# Patient Record
Sex: Male | Born: 1970 | Race: White | Hispanic: No | Marital: Single | Smoking: Never smoker
Health system: Southern US, Community
[De-identification: ages and names within clinical notes are randomized; demographics above are authoritative.]

## PROBLEM LIST (undated history)

## (undated) DIAGNOSIS — Z87442 Personal history of urinary calculi: Secondary | ICD-10-CM

## (undated) DIAGNOSIS — N200 Calculus of kidney: Secondary | ICD-10-CM

## (undated) HISTORY — PX: HERNIA REPAIR: SHX51

## (undated) HISTORY — PX: ARTHROSCOPIC REPAIR ACL: SUR80

---

## 2015-05-27 ENCOUNTER — Encounter (HOSPITAL_COMMUNITY): Payer: Self-pay | Admitting: Emergency Medicine

## 2015-05-27 ENCOUNTER — Emergency Department (HOSPITAL_COMMUNITY)
Admission: EM | Admit: 2015-05-27 | Discharge: 2015-05-27 | Disposition: A | Discharge status: Home / Self Care | Payer: BC Managed Care – PPO | Attending: Emergency Medicine | Admitting: Emergency Medicine

## 2015-05-27 ENCOUNTER — Emergency Department (HOSPITAL_COMMUNITY): Payer: BC Managed Care – PPO

## 2015-05-27 DIAGNOSIS — N23 Unspecified renal colic: Secondary | ICD-10-CM

## 2015-05-27 DIAGNOSIS — Z87442 Personal history of urinary calculi: Secondary | ICD-10-CM | POA: Insufficient documentation

## 2015-05-27 DIAGNOSIS — R109 Unspecified abdominal pain: Secondary | ICD-10-CM | POA: Diagnosis present

## 2015-05-27 HISTORY — DX: Calculus of kidney: N20.0

## 2015-05-27 LAB — URINALYSIS, ROUTINE W REFLEX MICROSCOPIC
BILIRUBIN URINE: NEGATIVE
GLUCOSE, UA: NEGATIVE mg/dL
Ketones, ur: NEGATIVE mg/dL
Leukocytes, UA: NEGATIVE
Nitrite: NEGATIVE
PH: 8 (ref 5.0–8.0)
Protein, ur: NEGATIVE mg/dL
SPECIFIC GRAVITY, URINE: 1.02 (ref 1.005–1.030)
Urobilinogen, UA: 0.2 mg/dL (ref 0.0–1.0)

## 2015-05-27 LAB — URINE MICROSCOPIC-ADD ON

## 2015-05-27 MED ORDER — ONDANSETRON HCL 4 MG/2ML IJ SOLN
4.0000 mg | Freq: Once | INTRAMUSCULAR | Status: AC
  Administered 2015-05-27: 4 mg via INTRAVENOUS
  Filled 2015-05-27: qty 2

## 2015-05-27 MED ORDER — HYDROCODONE-ACETAMINOPHEN 5-325 MG PO TABS: 2.0000 | ORAL_TABLET | ORAL | Status: AC | PRN

## 2015-05-27 MED ORDER — KETOROLAC TROMETHAMINE 30 MG/ML IJ SOLN
30.0000 mg | Freq: Once | INTRAMUSCULAR | Status: AC
  Administered 2015-05-27: 30 mg via INTRAVENOUS
  Filled 2015-05-27: qty 1

## 2015-05-27 MED ORDER — TAMSULOSIN HCL 0.4 MG PO CAPS: 0.4000 mg | ORAL_CAPSULE | Freq: Every day | ORAL | Status: AC

## 2015-05-27 MED ORDER — HYDROMORPHONE HCL 1 MG/ML IJ SOLN
1.0000 mg | Freq: Once | INTRAMUSCULAR | Status: AC
Start: 2015-05-27 — End: 2015-05-27
  Administered 2015-05-27: 1 mg via INTRAVENOUS
  Filled 2015-05-27: qty 1

## 2015-05-27 MED ORDER — ONDANSETRON 8 MG PO TBDP: 8.0000 mg | ORAL_TABLET | Freq: Three times a day (TID) | ORAL | Status: AC | PRN

## 2015-05-27 MED ORDER — OXYCODONE-ACETAMINOPHEN 5-325 MG PO TABS: 1.0000 | ORAL_TABLET | ORAL | Status: DC | PRN

## 2015-05-27 NOTE — ED Notes (Signed)
Pt currently does not want anti nausea medicine.

## 2015-05-27 NOTE — ED Notes (Signed)
Pt is in to much pain to give a urine sample at this time.

## 2015-05-27 NOTE — ED Provider Notes (Signed)
CSN: [id number]     Arrival date & time 05/27/15  4098 History   First MD Initiated Contact with Patient 05/27/15 [phone]     Chief Complaint  Patient presents with  . Flank Pain     (Consider location/radiation/quality/duration/timing/severity/associated sxs/prior Treatment) HPI Comments: H/o kidney stones and this is similar--usally caused by dehydration--pain at right flank and radiates to rlq  Patient is a 44 y.o. male presenting with flank pain. The history is provided by the patient.  Flank Pain This is a recurrent problem. The current episode started 1 to 2 hours ago. The problem occurs constantly. The problem has been rapidly worsening. Nothing aggravates the symptoms. Nothing relieves the symptoms. He has tried nothing for the symptoms.    Past Medical History  Diagnosis Date  . Kidney stone    Past Surgical History  Procedure Laterality Date  . Arthroscopic repair acl    . Hernia repair     Family History  Problem Relation Age of Onset  . CAD Father   . CAD Brother    Social History  Substance Use Topics  . Smoking status: Never Smoker   . Smokeless tobacco: None  . Alcohol Use: No    Review of Systems  Genitourinary: Positive for flank pain.  All other systems reviewed and are negative.     Allergies  Review of patient's allergies indicates no known allergies.  Home Medications   Prior to Admission medications   Not on File   BP 104/59 mmHg  Pulse 87  Resp 24  SpO2 100% Physical Exam  Constitutional: He is oriented to person, place, and time. He appears well-developed and well-nourished.  Non-toxic appearance. No distress.  HENT:  Head: Normocephalic and atraumatic.  Eyes: Conjunctivae, EOM and lids are normal. Pupils are equal, round, and reactive to light.  Neck: Normal range of motion. Neck supple. No tracheal deviation present. No thyroid mass present.  Cardiovascular: Normal rate, regular rhythm and normal heart sounds.  Exam reveals no  gallop.   No murmur heard. Pulmonary/Chest: Effort normal and breath sounds normal. No stridor. No respiratory distress. He has no decreased breath sounds. He has no wheezes. He has no rhonchi. He has no rales.  Abdominal: Soft. Normal appearance and bowel sounds are normal. He exhibits no distension. There is tenderness. There is CVA tenderness. There is no rebound.  Musculoskeletal: Normal range of motion. He exhibits no edema or tenderness.  Neurological: He is alert and oriented to person, place, and time. He has normal strength. No cranial nerve deficit or sensory deficit. GCS eye subscore is 4. GCS verbal subscore is 5. GCS motor subscore is 6.  Skin: Skin is warm and dry. No abrasion and no rash noted.  Psychiatric: He has a normal mood and affect. His speech is normal and behavior is normal.  Nursing note and vitals reviewed.   ED Course  Procedures (including critical care time) Labs Review Labs Reviewed  URINALYSIS, ROUTINE W REFLEX MICROSCOPIC (NOT AT [facility])    Imaging Review No results found. I have personally reviewed and evaluated these images and lab results as part of my medical decision-making.   EKG Interpretation None      MDM   Final diagnoses:  None   patient given pain meds here feels better. Results of his abdominal CT discussed with him and will be given pain meds and he can follow-up his urologist    Lorre Nick, MD 05/27/15 1004

## 2015-05-27 NOTE — ED Notes (Signed)
Pt states he woke this morning with right flank pain  Pt states he has had nausea and vomiting with the pain  Pt states the pain is now radiating around to the front

## 2015-05-27 NOTE — ED Notes (Signed)
Nurse tried to obtain a temp for pt but pt was unable to hold it in his mouth.

## 2015-05-27 NOTE — ED Notes (Signed)
Pt reminded of need for urine spcimen

## 2015-05-27 NOTE — Discharge Instructions (Signed)
Follow-up with your urologist in a week Kidney Stones Kidney stones (urolithiasis) are deposits that form inside your kidneys. The intense pain is caused by the stone moving through the urinary tract. When the stone moves, the ureter goes into spasm around the stone. The stone is usually passed in the urine.  CAUSES   A disorder that makes certain neck glands produce too much parathyroid hormone (primary hyperparathyroidism).  A buildup of uric acid crystals, similar to gout in your joints.  Narrowing (stricture) of the ureter.  A kidney obstruction present at birth (congenital obstruction).  Previous surgery on the kidney or ureters.  Numerous kidney infections. SYMPTOMS   Feeling sick to your stomach (nauseous).  Throwing up (vomiting).  Blood in the urine (hematuria).  Pain that usually spreads (radiates) to the groin.  Frequency or urgency of urination. DIAGNOSIS   Taking a history and physical exam.  Blood or urine tests.  CT scan.  Occasionally, an examination of the inside of the urinary bladder (cystoscopy) is performed. TREATMENT   Observation.  Increasing your fluid intake.  Extracorporeal shock wave lithotripsy--This is a noninvasive procedure that uses shock waves to break up kidney stones.  Surgery may be needed if you have severe pain or persistent obstruction. There are various surgical procedures. Most of the procedures are performed with the use of small instruments. Only small incisions are needed to accommodate these instruments, so recovery time is minimized. The size, location, and chemical composition are all important variables that will determine the proper choice of action for you. Talk to your health care provider to better understand your situation so that you will minimize the risk of injury to yourself and your kidney.  HOME CARE INSTRUCTIONS   Drink enough water and fluids to keep your urine clear or pale yellow. This will help you to pass  the stone or stone fragments.  Strain all urine through the provided strainer. Keep all particulate matter and stones for your health care provider to see. The stone causing the pain may be as small as a grain of salt. It is very important to use the strainer each and every time you pass your urine. The collection of your stone will allow your health care provider to analyze it and verify that a stone has actually passed. The stone analysis will often identify what you can do to reduce the incidence of recurrences.  Only take over-the-counter or prescription medicines for pain, discomfort, or fever as directed by your health care provider.  Make a follow-up appointment with your health care provider as directed.  Get follow-up X-rays if required. The absence of pain does not always mean that the stone has passed. It may have only stopped moving. If the urine remains completely obstructed, it can cause loss of kidney function or even complete destruction of the kidney. It is your responsibility to make sure X-rays and follow-ups are completed. Ultrasounds of the kidney can show blockages and the status of the kidney. Ultrasounds are not associated with any radiation and can be performed easily in a matter of minutes. SEEK MEDICAL CARE IF:  You experience pain that is progressive and unresponsive to any pain medicine you have been prescribed. SEEK IMMEDIATE MEDICAL CARE IF:   Pain cannot be controlled with the prescribed medicine.  You have a fever or shaking chills.  The severity or intensity of pain increases over 18 hours and is not relieved by pain medicine.  You develop a new onset of abdominal pain.  You feel faint or pass out.  You are unable to urinate. MAKE SURE YOU:   Understand these instructions.  Will watch your condition.  Will get help right away if you are not doing well or get worse. Document Released: 09/12/2005 Document Revised: 05/15/2013 Document Reviewed:  02/13/2013 [facility] Patient Information 2015 [place], [place]. This information is not intended to replace advice given to you by your health care provider. Make sure you discuss any questions you have with your health care provider.

## 2015-05-27 NOTE — ED Notes (Signed)
Pt given cup of water to attempt to provide urine specimen

## 2016-11-19 ENCOUNTER — Encounter (HOSPITAL_COMMUNITY): Payer: Self-pay | Admitting: Nurse Practitioner

## 2016-11-19 ENCOUNTER — Emergency Department (HOSPITAL_COMMUNITY): Payer: BC Managed Care – PPO

## 2016-11-19 ENCOUNTER — Emergency Department (HOSPITAL_COMMUNITY)
Admission: EM | Admit: 2016-11-19 | Discharge: 2016-11-19 | Disposition: A | Discharge status: Home / Self Care | Payer: BC Managed Care – PPO | Attending: Emergency Medicine | Admitting: Emergency Medicine

## 2016-11-19 DIAGNOSIS — W268XXA Contact with other sharp object(s), not elsewhere classified, initial encounter: Secondary | ICD-10-CM | POA: Diagnosis not present

## 2016-11-19 DIAGNOSIS — Z23 Encounter for immunization: Secondary | ICD-10-CM | POA: Insufficient documentation

## 2016-11-19 DIAGNOSIS — Y929 Unspecified place or not applicable: Secondary | ICD-10-CM | POA: Diagnosis not present

## 2016-11-19 DIAGNOSIS — Z79899 Other long term (current) drug therapy: Secondary | ICD-10-CM | POA: Diagnosis not present

## 2016-11-19 DIAGNOSIS — S61211A Laceration without foreign body of left index finger without damage to nail, initial encounter: Secondary | ICD-10-CM | POA: Insufficient documentation

## 2016-11-19 DIAGNOSIS — Y999 Unspecified external cause status: Secondary | ICD-10-CM | POA: Insufficient documentation

## 2016-11-19 DIAGNOSIS — Z7982 Long term (current) use of aspirin: Secondary | ICD-10-CM | POA: Insufficient documentation

## 2016-11-19 DIAGNOSIS — Y939 Activity, unspecified: Secondary | ICD-10-CM | POA: Insufficient documentation

## 2016-11-19 MED ORDER — TETANUS-DIPHTH-ACELL PERTUSSIS 5-2.5-18.5 LF-MCG/0.5 IM SUSP
0.5000 mL | Freq: Once | INTRAMUSCULAR | Status: AC
  Administered 2016-11-19: 0.5 mL via INTRAMUSCULAR
  Filled 2016-11-19: qty 0.5

## 2016-11-19 MED ORDER — LIDOCAINE HCL 2 % IJ SOLN
20.0000 mL | Freq: Once | INTRAMUSCULAR | Status: AC
Start: 2016-11-19 — End: 2016-11-19
  Administered 2016-11-19: 400 mg
  Filled 2016-11-19: qty 20

## 2016-11-19 MED ORDER — CEPHALEXIN 500 MG PO CAPS: 500.0000 mg | ORAL_CAPSULE | Freq: Two times a day (BID) | ORAL | 0 refills | Status: DC

## 2016-11-19 NOTE — ED Triage Notes (Addendum)
Pt presents with c/o laceration. He has deep laceration L 2nd digit, cut with pruner just prior to arrival. He washed with water, held pressure, and came straight to ED. Bleeding is controlled now. He does not have any pain now. He is able to move the digit and reports normal sensation.

## 2016-11-19 NOTE — ED Provider Notes (Signed)
[facility]-EMERGENCY DEPT Provider Note   CSN: [id number] Arrival date & time: 11/19/16  1125     History   Chief Complaint Chief Complaint  Patient presents with  . Laceration    HPI Bob Anderson is a 46 y.o. male.  HPI   46 year old male presents today with laceration to his left second digit.  Patient reports he was cutting trees when he lacerated his finger.  He notes full active range of motion, distal sensation intact.  He rinsed the wound with water held pressure with good hemostasis.  Patient is uncertain when his last tetanus was and would like another one.    Past Medical History:  Diagnosis Date  . Kidney stone     There are no active problems to display for this patient.   Past Surgical History:  Procedure Laterality Date  . ARTHROSCOPIC REPAIR ACL    . HERNIA REPAIR         Home Medications    Prior to Admission medications   Medication Sig Start Date End Date Taking? Authorizing Provider  aspirin 325 MG tablet Take 325 mg by mouth daily.   Yes Historical Provider, MD  glucosamine-chondroitin 500-400 MG tablet Take 2 tablets by mouth daily.   Yes Historical Provider, MD  Omega-3 Fatty Acids (FISH OIL) 1000 MG CAPS Take 1,000 mg by mouth daily.   Yes Historical Provider, MD  predniSONE (DELTASONE) 10 MG tablet Take 10 mg by mouth daily as needed (inflamation).   Yes Historical Provider, MD  cephALEXin (KEFLEX) 500 MG capsule Take 1 capsule (500 mg total) by mouth 2 (two) times daily. 11/19/16   Eyvonne MechanicJeffrey Marquesha Robideau, PA-C  HYDROcodone-acetaminophen (NORCO/VICODIN) 5-325 MG per tablet Take 2 tablets by mouth every 4 (four) hours as needed. Patient not taking: Reported on 11/19/2016 05/27/15   Lorre NickAnthony Allen, MD  ondansetron (ZOFRAN ODT) 8 MG disintegrating tablet Take 1 tablet (8 mg total) by mouth every 8 (eight) hours as needed for nausea or vomiting. Patient not taking: Reported on 11/19/2016 05/27/15   Lorre NickAnthony Allen, MD  tamsulosin [facility](FLOMAX) 0.4 MG CAPS capsule Take 1  capsule (0.4 mg total) by mouth daily. Patient not taking: Reported on 11/19/2016 05/27/15   Lorre NickAnthony Allen, MD    Family History Family History  Problem Relation Age of Onset  . CAD Father   . CAD Brother     Social History Social History  Substance Use Topics  . Smoking status: Never Smoker  . Smokeless tobacco: Never Used  . Alcohol use No     Allergies   Patient has no known allergies.   Review of Systems Review of Systems  All other systems reviewed and are negative.    Physical Exam Updated Vital Signs BP 126/86 (BP Location: Right Arm)   Pulse 85   Temp 97.6 F (36.4 C) (Oral)   Resp 16   SpO2 99%   Physical Exam  Musculoskeletal:  Laceration noted on the left second digit along the radial side-no tendon, major vessel involvement.  Full active range of motion of the digit at all joints.  Distal sensation intact.  Nursing note and vitals reviewed.    ED Treatments / Results  Labs (all labs ordered are listed, but only abnormal results are displayed) Labs Reviewed - No data to display  EKG  EKG Interpretation None       Radiology Dg Finger Index Left  Result Date: 11/19/2016 CLINICAL DATA:  46 year old male with left index finger laceration induced by a hedge cutter.  EXAM: LEFT INDEX FINGER 2+V COMPARISON:  None. FINDINGS: Soft tissue irregularity along the radial and palmar aspect of the index finger at the level of the middle phalanx consistent with the clinical history of laceration. There is no evidence of acute fracture or imbedded radiopaque foreign body. IMPRESSION: Soft tissue laceration without underlying osseous injury or imbedded foreign body. Electronically Signed   By: Malachy Moan M.D.   On: 11/19/2016 12:44    Procedures Procedures (including critical care time)  LACERATION REPAIR Performed by: Thermon Leyland Authorized by: Thermon Leyland Consent: Verbal consent obtained. Risks and benefits: risks, benefits and  alternatives were discussed Consent given by: patient Patient identity confirmed: provided demographic data Prepped and Draped in normal sterile fashion Wound explored  Laceration Location: left second digit  Laceration Length: 2 cmcm  No Foreign Bodies seen or palpated  Anesthesia: local infiltration  Local anesthetic: digital block  Anesthetic total: 4 ml  Irrigation method: syringe Amount of cleaning: standard  Skin closure: simple  Number of sutures: 6  Technique: SI  Patient tolerance: Patient tolerated the procedure well with no immediate complications.  Medications Ordered in ED Medications  lidocaine (XYLOCAINE) 2 % (with pres) injection 400 mg (400 mg Infiltration Given 11/19/16 1255)  Tdap (BOOSTRIX) injection 0.5 mL (0.5 mLs Intramuscular Given 11/19/16 1330)     Initial Impression / Assessment and Plan / ED Course  I have reviewed the triage vital signs and the nursing notes.  Pertinent labs & imaging results that were available during my care of the patient were reviewed by me and considered in my medical decision making (see chart for details).     Final Clinical Impressions(s) / ED Diagnoses   Final diagnoses:  Laceration of left index finger without damage to nail, foreign body presence unspecified, initial encounter    Labs: DG. finger left  Imaging:  Consults:  Therapeutics: Lidocaine  Discharge Meds: kelfex  Assessment/Plan:   46 year old male presents today with a laceration to his finger.  He has full active range of motion finger at all joints, sensation intact, bleeding controlled.  No signs of major nerve, vessel, tendon involvement.  Wound was copiously irrigated and closed without complication.  Patient was trimming trees and concern for examination.  He will be placed on 3 days of prophylactic antibiotic, close follow-up with primary care, return precautions given.  Patient verbalized understanding and agreement to this plan and  had no further questions or concerns.      New Prescriptions New Prescriptions   CEPHALEXIN (KEFLEX) 500 MG CAPSULE    Take 1 capsule (500 mg total) by mouth 2 (two) times daily.     Eyvonne Mechanic, PA-C 11/19/16 1354    Raeford Razor, MD 12/05/16 [phone]

## 2016-11-19 NOTE — Discharge Instructions (Signed)
Please read attached information. If you experience any new or worsening signs or symptoms please return to the emergency room for evaluation. Please follow-up with your primary care provider or specialist as discussed. Please use medication prescribed only as directed and discontinue taking if you have any concerning signs or symptoms.   °

## 2016-11-19 NOTE — ED Notes (Signed)
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

## 2017-05-26 ENCOUNTER — Other Ambulatory Visit: Payer: Self-pay | Admitting: Urology

## 2017-06-01 ENCOUNTER — Other Ambulatory Visit (HOSPITAL_COMMUNITY): Payer: BC Managed Care – PPO

## 2017-06-02 NOTE — Patient Instructions (Addendum)
Bob AllanDavid Anderson  06/02/2017   Your procedure is scheduled on: 06-08-17   [facility]  Entr[facility] Take [facility] [facility] to 3rd floor to  Short Stay Center at 10:45 AM.   Call this number if you have problems the morning of surgery [phone]    Remember: ONLY 1 PERSON MAY GO WITH YOU TO SHORT STAY TO GET  READY MORNING OF YOUR SURGERY.  Do not eat food or drink liquids :After Midnight. You may have a Clear Liquid Diet from Midnight until 7:15 AM. After 7:15AM, nothing until after surgery.     CLEAR LIQUID DIET   Foods Allowed                                                                     Foods Excluded  Coffee and tea, regular and decaf                             liquids that you cannot  Plain Jell-O in any flavor                                             see through such as: Fruit ices (not with fruit pulp)                                     milk, soups, orange juice  Iced Popsicles                                    All solid food Carbonated beverages, regular and diet                                    Cranberry, grape and apple juices Sports drinks like Gatorade Lightly seasoned clear broth or consume(fat free) Sugar, honey syrup  Sample Menu Breakfast                                Lunch                                     Supper Cranberry juice                    Beef broth                            Chicken broth Jell-O                                     Grape juice  Apple juice Coffee or tea                        Jell-O                                      Popsicle                                                Coffee or tea                        Coffee or tea  _____________________________________________________________________     Take these medicines the morning of surgery with A SIP OF WATER: None                                You may not have any metal on your body including hair pins and             piercings  Do not wear jewelry, make-up, lotions, powders or perfumes, deodorant             Men may shave face and neck.   Do not bring valuables to the hospital. [place] IS NOT             RESPONSIBLE   FOR VALUABLES.  Contacts, dentures or bridgework may not be worn into surgery.     Patients discharged the day of surgery will not be allowed to drive home.  Name and phone number of your driver: Tyler [phone]               Please read over the following fact sheets you were given: _____________________________________________________________________             [facility] - Preparing for Surgery Before surgery, you can play an important role.  Because skin is not sterile, your skin needs to be as free of germs as possible.  You can reduce the number of germs on your skin by washing with CHG (chlorahexidine gluconate) soap before surgery.  CHG is an antiseptic cleaner which kills germs and bonds with the skin to continue killing germs even after washing. Please DO NOT use if you have an allergy to CHG or antibacterial soaps.  If your skin becomes reddened/irritated stop using the CHG and inform your nurse when you arrive at Short Stay. Do not shave (including legs and underarms) for at least 48 hours prior to the first CHG shower.  You may shave your face/neck. Please follow these instructions carefully:  1.  Shower with CHG Soap the night before surgery and the  morning of Surgery.  2.  If you choose to wash your hair, wash your hair first as usual with your  normal  shampoo.  3.  After you shampoo, rinse your hair and body thoroughly to remove the  shampoo.                           4.  Use CHG as you would any other liquid soap.  You can apply chg directly  to the skin and wash  Gently with a scrungie or clean washcloth.  5.  Apply the CHG Soap to your body ONLY FROM THE NECK DOWN.   Do not use on face/ open                           Wound or  open sores. Avoid contact with eyes, ears mouth and genitals (private parts).                       Wash face,  Genitals (private parts) with your normal soap.             6.  Wash thoroughly, paying special attention to the area where your surgery  will be performed.  7.  Thoroughly rinse your body with warm water from the neck down.  8.  DO NOT shower/wash with your normal soap after using and rinsing off  the CHG Soap.                9.  Pat yourself dry with a clean towel.            10.  Wear clean pajamas.            11.  Place clean sheets on your bed the night of your first shower and do not  sleep with pets. Day of Surgery : Do not apply any lotions/deodorants the morning of surgery.  Please wear clean clothes to the hospital/surgery center.  FAILURE TO FOLLOW THESE INSTRUCTIONS MAY RESULT IN THE CANCELLATION OF YOUR SURGERY PATIENT SIGNATURE_________________________________  NURSE SIGNATURE__________________________________  ________________________________________________________________________

## 2017-06-06 ENCOUNTER — Encounter (HOSPITAL_COMMUNITY): Payer: Self-pay

## 2017-06-06 ENCOUNTER — Encounter (HOSPITAL_COMMUNITY)
Admission: RE | Admit: 2017-06-06 | Discharge: 2017-06-06 | Disposition: A | Discharge status: Home / Self Care | Payer: BC Managed Care – PPO | Source: Ambulatory Visit | Attending: Urology | Admitting: Urology

## 2017-06-06 DIAGNOSIS — Z87442 Personal history of urinary calculi: Secondary | ICD-10-CM | POA: Diagnosis not present

## 2017-06-06 DIAGNOSIS — N201 Calculus of ureter: Secondary | ICD-10-CM | POA: Diagnosis not present

## 2017-06-06 DIAGNOSIS — N411 Chronic prostatitis: Secondary | ICD-10-CM | POA: Diagnosis not present

## 2017-06-06 HISTORY — DX: Personal history of urinary calculi: Z87.442

## 2017-06-06 LAB — CBC
HCT: 41.5 % (ref 39.0–52.0)
Hemoglobin: 13.9 g/dL (ref 13.0–17.0)
MCH: 30 pg (ref 26.0–34.0)
MCHC: 33.5 g/dL (ref 30.0–36.0)
MCV: 89.4 fL (ref 78.0–100.0)
PLATELETS: 293 10*3/uL (ref 150–400)
RBC: 4.64 MIL/uL (ref 4.22–5.81)
RDW: 13.5 % (ref 11.5–15.5)
WBC: 5.6 10*3/uL (ref 4.0–10.5)

## 2017-06-06 LAB — BASIC METABOLIC PANEL
ANION GAP: 7 (ref 5–15)
BUN: 10 mg/dL (ref 6–20)
CALCIUM: 9.4 mg/dL (ref 8.9–10.3)
CHLORIDE: 105 mmol/L (ref 101–111)
CO2: 27 mmol/L (ref 22–32)
CREATININE: 0.96 mg/dL (ref 0.61–1.24)
GFR calc non Af Amer: >60 mL/min (ref >60)
GLUCOSE: 96 mg/dL (ref 65–99)
Potassium: 4.5 mmol/L (ref 3.5–5.1)
Sodium: 139 mmol/L (ref 135–145)

## 2017-06-07 NOTE — H&P (Signed)
  Print    Office Visit Report 05/25/2017    Bob Anderson         MRN: [id number]  PRIMARY CARE:  Bethann Punches, MD  DOB: 12-09-1970, 46 year old Male  REFERRING:  Bethann Punches, MD  SSN: [id number]  PROVIDER:  Ihor Gully, M.D.    TREATING:  Heloise Purpura, M.D.    LOCATION:  [facility] [phone]    CC/HPI: Right ureteral calculus   Bob Anderson returns today after having been diagnosed with a 3 mm distal right ureteral calculus by Dr. Vernie Ammons last week. He did have one stone episode 78 years ago and was able to pass this spontaneously. He also had a second stone episode 2 years ago and passed that stone spontaneously. No stone analysis was performed. 10 days ago, he developed the acute onset of severe right-sided flank pain with associated nausea and vomiting. His pain was relatively manageable until last Thursday when it again became severe and he presented to Dr. Vernie Ammons. He was prescribed pain medication although has had no pain since last Thursday evening. He has denied any fevers. Currently, he is pain-free. He has been very compliant with straining his urine but has not passed a stone.     ALLERGIES: Sulfa Antibiotics  Sulfa Drugs    MEDICATIONS: Hydrocodone-Acetaminophen 5 mg-300 mg tablet 1-2 tablet PO Q 4 H  Cipro 750 mg tablet Oral  Fish Oil  Flomax 0.4 mg capsule, ext release 24 hr 0 Oral Bedtime  Glucosamine & Chondroitin  Ibuprofen     GU PSH: None     PSH Notes: Hernia Repair, ACL replacement (2014)   NON-GU PSH: Hernia Repair - 2008        GU PMH: Flank Pain (Acute), Right, He has a 2.5 mm distal right ureteral stone. I told him that he had a fairly high probability of spontaneous passage. He already is on tamsulosin so I'm going to have him increase that to 0.8 mg and I have given him a urine strainer. I also have supplied him with pain medication. He told me that he has an appointment with Dr. Laverle Patter next week so I'll hold off on ordering a KUB  in case he passes his stone in the interim. - 05/18/2017 Chronic prostatitis, Prostatitis, chronic - 2014 Pelvic/perineal pain, Male pelvic pain - 2014 Ureteral calculus      PMH Notes:   1) Prostatitis/CPPS: I initially evaluated [facility] in 2008 for pelvic pain. He had a history of prostatitis and we discussed conservative therapy and physical therapy. He ultimately did not feel his symptoms were bothersome enough to warrant physical therapy.   2) Urolithiasis: He has a history of recurrent urolithiasis.    NON-GU PMH: None    FAMILY HISTORY: Arthritis - Mother nephrolithiasis - Father Prostate Cancer - Father    SOCIAL HISTORY: Marital Status: Single Preferred Language: English; Ethnicity: Not Hispanic Or Latino; Race: White Current Smoking Status: Patient has never smoked.  <DIV'  Tobacco Use Assessment Completed:  Used Tobacco in last 30 days?   Has never drank.  Patient's occupation Pharmacist, community, farmer.      Notes: Alcohol Use, Marital History - Single, Tobacco Use, Occupation:, Caffeine Use    REVIEW OF SYSTEMS:     GU Review Male:  Patient denies frequent urination, hard to postpone urination, burning/ pain with urination, get up at night to urinate, leakage of urine, stream starts and stops, trouble starting your streams,  and have to strain to urinate .    Gastrointestinal (Upper):  Patient denies nausea and vomiting.    Gastrointestinal (Lower):  Patient denies diarrhea and constipation.    Constitutional:  Patient denies fever, night sweats, weight loss, and fatigue.    Skin:  Patient denies skin rash/ lesion and itching.    Eyes:  Patient denies blurred vision and double vision.    Ears/ Nose/ Throat:  Patient denies sore throat and sinus problems.    Hematologic/Lymphatic:  Patient denies swollen glands and easy bruising.    Cardiovascular:  Patient denies leg swelling and chest pains.    Respiratory:  Patient denies shortness of breath and cough.    Endocrine:   Patient denies excessive thirst.    Musculoskeletal:  Patient denies back pain and joint pain.    Neurological:  Patient denies headaches and dizziness.    Psychologic:  Patient denies depression and anxiety.    VITAL SIGNS:       05/25/2017 08:18 AM     Weight 150 lb / 68.04 kg     BP 122/81 mmHg     Pulse 67 /min     MULTI-SYSTEM PHYSICAL EXAMINATION:      Constitutional: Well-nourished. No physical deformities. Normally developed. Good grooming.     Respiratory: No labored breathing, no use of accessory muscles.      Cardiovascular: Normal temperature, normal extremity pulses, no swelling, no varicosities.     Gastrointestinal: No mass, no tenderness, no rigidity, non obese abdomen. No CVA tenderness.            PAST DATA REVIEWED:   Source Of History:  Patient  Urine Test Review:  Urinalysis  X-Ray Review: KUB: Reviewed Films. I independently reviewed his KUB. He does have pelvic phleboliths and there is a calcification that correlates to his stone position measuring approximately 3 mm just above his phlebolith on the right side. C.T. Abdomen/Pelvis: Reviewed Films.     PROCEDURES:    KUB - [id number]  A single view of the abdomen is obtained.          Urinalysis w/Scope  Dipstick Dipstick Cont'd Micro  Color: Yellow Bilirubin: Neg WBC/hpf: NS (Not Seen)  Appearance: Clear Ketones: Neg RBC/hpf: 10 - 20/hpf  Specific Gravity: 1.025 Blood: 3+ Bacteria: Rare (0-9/hpf)  pH: <=5.0 Protein: Neg Cystals: NS (Not Seen)  Glucose: Neg Urobilinogen: 0.2 Casts: NS (Not Seen)   Nitrites: Neg Trichomonas: Not Present   Leukocyte Esterase: Neg Mucous: Present    Epithelial Cells: 0 - 5/hpf    Yeast: NS (Not Seen)    Sperm: Not Present    ASSESSMENT:     ICD-10 Details  1 GU:  Ureteral calculus - N20.1    PLAN:   Orders  X-Rays: KUB  Schedule  Return Visit/Planned Activity: Other See Visit Notes  Note: Will call to schedule surgery  Document  Letter(s):  Created for  Patient: Clinical Summary   Notes:  1. Right ureteral calculus: Considering his urinalysis and his KUB, I do not think he has passed his stone. He will continue to strain his urine and continue with medical expulsion therapy. He does have tamsulosin that he has been taking. He will tentatively plan for treatment with ureteroscopy in the next couple of weeks if he has not passed a stone. We reviewed this procedure in detail including the potential risks, complications, and expected recovery process as well as the potential need for a postoperative ureteral stent. He will call  us should he develop uncontrolled pain, fever, or persistent nausea/vomiting.   2. Recurrent urolithiasis: I will recommend a metabolic evaluation following his acute event.   Cc: Dr. Bethann Punches   * Signed by Heloise Purpura, M.D. on 05/25/17 at 6:07 PM (EDT)*

## 2017-06-08 ENCOUNTER — Encounter (HOSPITAL_COMMUNITY): Payer: Self-pay | Admitting: *Deleted

## 2017-06-08 ENCOUNTER — Encounter (HOSPITAL_COMMUNITY)
Admission: RE | Disposition: A | Discharge status: Home / Self Care | Payer: Self-pay | Source: Ambulatory Visit | Attending: Urology

## 2017-06-08 ENCOUNTER — Ambulatory Visit (HOSPITAL_COMMUNITY): Payer: BC Managed Care – PPO

## 2017-06-08 ENCOUNTER — Ambulatory Visit (HOSPITAL_COMMUNITY)
Admission: RE | Admit: 2017-06-08 | Discharge: 2017-06-08 | Disposition: A | Discharge status: Home / Self Care | Payer: BC Managed Care – PPO | Source: Ambulatory Visit | Attending: Urology | Admitting: Urology

## 2017-06-08 DIAGNOSIS — N411 Chronic prostatitis: Secondary | ICD-10-CM | POA: Insufficient documentation

## 2017-06-08 DIAGNOSIS — N201 Calculus of ureter: Secondary | ICD-10-CM | POA: Diagnosis not present

## 2017-06-08 DIAGNOSIS — Z87442 Personal history of urinary calculi: Secondary | ICD-10-CM | POA: Insufficient documentation

## 2017-06-08 HISTORY — PX: CYSTOSCOPY/URETEROSCOPY/HOLMIUM LASER/STENT PLACEMENT: SHX6546

## 2017-06-08 SURGERY — CYSTOSCOPY/URETEROSCOPY/HOLMIUM LASER/STENT PLACEMENT
Anesthesia: General | Laterality: Right

## 2017-06-08 MED ORDER — LIDOCAINE 2% (20 MG/ML) 5 ML SYRINGE
INTRAMUSCULAR | Status: AC
  Filled 2017-06-08: qty 5

## 2017-06-08 MED ORDER — HYDROCODONE-ACETAMINOPHEN 5-325 MG PO TABS
1.0000 | ORAL_TABLET | ORAL | Status: DC | PRN
  Administered 2017-06-08: 1 via ORAL
  Filled 2017-06-08: qty 1

## 2017-06-08 MED ORDER — DEXAMETHASONE SODIUM PHOSPHATE 10 MG/ML IJ SOLN
INTRAMUSCULAR | Status: AC
  Filled 2017-06-08: qty 1

## 2017-06-08 MED ORDER — FENTANYL CITRATE (PF) 100 MCG/2ML IJ SOLN
INTRAMUSCULAR | Status: AC
  Filled 2017-06-08: qty 2

## 2017-06-08 MED ORDER — ONDANSETRON HCL 4 MG/2ML IJ SOLN
INTRAMUSCULAR | Status: AC
  Filled 2017-06-08: qty 2

## 2017-06-08 MED ORDER — LACTATED RINGERS IV SOLN
INTRAVENOUS | Status: DC
  Administered 2017-06-08: 13:00:00 via INTRAVENOUS

## 2017-06-08 MED ORDER — MIDAZOLAM HCL 2 MG/2ML IJ SOLN
INTRAMUSCULAR | Status: AC
  Filled 2017-06-08: qty 2

## 2017-06-08 MED ORDER — LIDOCAINE 2% (20 MG/ML) 5 ML SYRINGE
INTRAMUSCULAR | Status: DC | PRN
  Administered 2017-06-08: 80 mg via INTRAVENOUS

## 2017-06-08 MED ORDER — FENTANYL CITRATE (PF) 100 MCG/2ML IJ SOLN
25.0000 ug | INTRAMUSCULAR | Status: DC | PRN
  Administered 2017-06-08: 50 ug via INTRAVENOUS
  Administered 2017-06-08: 25 ug via INTRAVENOUS

## 2017-06-08 MED ORDER — MIDAZOLAM HCL 5 MG/5ML IJ SOLN
INTRAMUSCULAR | Status: DC | PRN
  Administered 2017-06-08: 2 mg via INTRAVENOUS

## 2017-06-08 MED ORDER — ONDANSETRON HCL 4 MG/2ML IJ SOLN
INTRAMUSCULAR | Status: DC | PRN
  Administered 2017-06-08: 4 mg via INTRAVENOUS

## 2017-06-08 MED ORDER — PROPOFOL 10 MG/ML IV BOLUS
INTRAVENOUS | Status: AC
  Filled 2017-06-08: qty 20

## 2017-06-08 MED ORDER — PROMETHAZINE HCL 25 MG/ML IJ SOLN
6.2500 mg | INTRAMUSCULAR | Status: DC | PRN
  Administered 2017-06-08: 6.25 mg via INTRAVENOUS

## 2017-06-08 MED ORDER — SODIUM CHLORIDE 0.9 % IR SOLN
Status: DC | PRN
  Administered 2017-06-08: 3000 mL

## 2017-06-08 MED ORDER — PROPOFOL 10 MG/ML IV BOLUS
INTRAVENOUS | Status: DC | PRN
  Administered 2017-06-08: 150 mg via INTRAVENOUS

## 2017-06-08 MED ORDER — CEFAZOLIN SODIUM-DEXTROSE 2-4 GM/100ML-% IV SOLN
2.0000 g | INTRAVENOUS | Status: AC
  Administered 2017-06-08: 2 g via INTRAVENOUS
  Filled 2017-06-08: qty 100

## 2017-06-08 MED ORDER — PROMETHAZINE HCL 25 MG/ML IJ SOLN
INTRAMUSCULAR | Status: AC
  Filled 2017-06-08: qty 1

## 2017-06-08 MED ORDER — DEXAMETHASONE SODIUM PHOSPHATE 10 MG/ML IJ SOLN
INTRAMUSCULAR | Status: DC | PRN
  Administered 2017-06-08: 10 mg via INTRAVENOUS

## 2017-06-08 MED ORDER — PHENAZOPYRIDINE HCL 100 MG PO TABS: 100.0000 mg | ORAL_TABLET | Freq: Three times a day (TID) | ORAL | 0 refills | Status: AC | PRN

## 2017-06-08 MED ORDER — SODIUM CHLORIDE 0.9 % IR SOLN
Status: DC | PRN
  Administered 2017-06-08: 1000 mL

## 2017-06-08 MED ORDER — IOHEXOL 300 MG/ML  SOLN
INTRAMUSCULAR | Status: DC | PRN
  Administered 2017-06-08: 2 mL

## 2017-06-08 MED ORDER — FENTANYL CITRATE (PF) 100 MCG/2ML IJ SOLN
INTRAMUSCULAR | Status: DC | PRN
  Administered 2017-06-08 (×2): 50 ug via INTRAVENOUS

## 2017-06-08 MED FILL — PHENAZOPYRIDINE 100 MG TAB: 100 | 6 days supply | Qty: 20 | Fill #0

## 2017-06-08 SURGICAL SUPPLY — 19 items
BAG URO CATCHER STRL LF (MISCELLANEOUS) ×2 IMPLANT
BASKET ZERO TIP NITINOL 2.4FR (BASKET) ×2 IMPLANT
CATH INTERMIT  6FR 70CM (CATHETERS) ×2 IMPLANT
CLOTH BEACON ORANGE TIMEOUT ST (SAFETY) ×2 IMPLANT
COVER FOOTSWITCH UNIV (MISCELLANEOUS) IMPLANT
COVER SURGICAL LIGHT HANDLE (MISCELLANEOUS) IMPLANT
FIBER LASER FLEXIVA 365 (UROLOGICAL SUPPLIES) ×2 IMPLANT
FIBER LASER TRAC TIP (UROLOGICAL SUPPLIES) IMPLANT
GLOVE BIOGEL M STRL SZ7.5 (GLOVE) ×2 IMPLANT
GOWN STRL REUS W/TWL LRG LVL3 (GOWN DISPOSABLE) ×4 IMPLANT
GUIDEWIRE ANG ZIPWIRE 038X150 (WIRE) IMPLANT
GUIDEWIRE STR DUAL SENSOR (WIRE) ×4 IMPLANT
IV NS 1000ML (IV SOLUTION)
IV NS 1000ML BAXH (IV SOLUTION) IMPLANT
MANIFOLD NEPTUNE II (INSTRUMENTS) ×2 IMPLANT
PACK CYSTO (CUSTOM PROCEDURE TRAY) ×2 IMPLANT
SHEATH ACCESS URETERAL 38CM (SHEATH) IMPLANT
STENT URET 6FRX24 CONTOUR (STENTS) ×2 IMPLANT
TUBING CONNECTING 10 (TUBING) ×2 IMPLANT

## 2017-06-08 NOTE — OR Nursing (Signed)
Stone taken by Dr. Borden 

## 2017-06-08 NOTE — Anesthesia Procedure Notes (Signed)
Procedure Name: LMA Insertion Date/Time: 06/08/2017 1:35 PM Performed by: Orest DikesPETERS, Kendy Haston J Pre-anesthesia Checklist: Patient identified, Suction available, Emergency Drugs available and Patient being monitored Patient Re-evaluated:Patient Re-evaluated prior to induction Oxygen Delivery Method: Circle system utilized and Simple face mask Induction Type: IV induction LMA: LMA inserted LMA Size: 5.0

## 2017-06-08 NOTE — Progress Notes (Signed)
Assumed care from Michelle Herrscraft, RN 

## 2017-06-08 NOTE — Anesthesia Preprocedure Evaluation (Signed)
Anesthesia Evaluation  Patient identified by MRN, date of birth, ID band Patient awake    Reviewed: Allergy & Precautions, NPO status , Patient's Chart, lab work & pertinent test results  Airway Mallampati: II  TM Distance: >3 FB Neck ROM: Full    Dental  (+) Dental Advisory Given   Pulmonary neg pulmonary ROS,    breath sounds clear to auscultation       Cardiovascular negative cardio ROS   Rhythm:Regular Rate:Normal     Neuro/Psych negative neurological ROS     GI/Hepatic negative GI ROS, Neg liver ROS,   Endo/Other  negative endocrine ROS  Renal/GU Renal disease     Musculoskeletal   Abdominal   Peds  Hematology negative hematology ROS (+)   Anesthesia Other Findings   Reproductive/Obstetrics                             Anesthesia Physical Anesthesia Plan  ASA: I  Anesthesia Plan: General   Post-op Pain Management:    Induction: Intravenous  PONV Risk Score and Plan: 3 and Ondansetron, Dexamethasone, Midazolam and Treatment may vary due to age or medical condition  Airway Management Planned: LMA  Additional Equipment:   Intra-op Plan:   Post-operative Plan: Extubation in OR  Informed Consent: I have reviewed the patients History and Physical, chart, labs and discussed the procedure including the risks, benefits and alternatives for the proposed anesthesia with the patient or authorized representative who has indicated his/her understanding and acceptance.   Dental advisory given  Plan Discussed with: CRNA  Anesthesia Plan Comments:         Anesthesia Quick Evaluation

## 2017-06-08 NOTE — Anesthesia Postprocedure Evaluation (Signed)
Anesthesia Post Note  Patient: Bob Anderson  Procedure(s) Performed: Procedure(s) (LRB): CYSTOSCOPY/URETEROSCOPY/HOLMIUM LASER/STENT PLACEMENT/ RETROGRADE PYELOGRAM (Right)     Patient location during evaluation: PACU Anesthesia Type: General Level of consciousness: awake and alert Pain management: pain level controlled Vital Signs Assessment: post-procedure vital signs reviewed and stable Respiratory status: spontaneous breathing, nonlabored ventilation, respiratory function stable and patient connected to nasal cannula oxygen Cardiovascular status: blood pressure returned to baseline and stable Postop Assessment: no apparent nausea or vomiting Anesthetic complications: no    Last Vitals:  Vitals:   06/08/17 1545 06/08/17 1700  BP: 113/63 103/63  Pulse: (!) 48 (!) 56  Resp: 16 16  Temp: 36.7 C 37 C  SpO2: 98% 99%    Last Pain:  Vitals:   06/08/17 1700  TempSrc: Oral  PainSc: 4                  Kennieth RadFitzgerald, Willia Genrich E

## 2017-06-08 NOTE — Discharge Instructions (Addendum)
1. You may see some blood in the urine and may have some burning with urination for 48-72 hours. You also may notice that you have to urinate more frequently or urgently after your procedure which is normal.  2. You should call should you develop an inability urinate, fever > 101, persistent nausea and vomiting that prevents you from eating or drinking to stay hydrated.  3. If you have a stent, you will likely urinate more frequently and urgently until the stent is removed and you may experience some discomfort/pain in the lower abdomen and flank especially when urinating. You may take pain medication prescribed to you if needed for pain. You may also intermittently have blood in the urine until the stent is removed. 4. You may remove your stent on Sunday morning.  Simply pull the string that is taped to your body and the stent will easily come out.  This may be best done in the shower as some urine may come out with the stent.  Usually you will feel relief once the stent is removed, but occasionally patients can develop pain due to residual swelling of the ureter that may temporarily obstruct the kidney.  This can be managed by taking pain medication and it will typically resolve with time.  Please do not hesitate to call if you have pain that is not controlled with your pain medication or does not improved within 24-48 hours.    General Anesthesia, Adult, Care After These instructions provide you with information about caring for yourself after your procedure. Your health care provider may also give you more specific instructions. Your treatment has been planned according to current medical practices, but problems sometimes occur. Call your health care provider if you have any problems or questions after your procedure. What can I expect after the procedure? After the procedure, it is common to have:  Vomiting.  A sore throat.  Mental slowness.  It is common to feel:  Nauseous.  Cold or  shivery.  Sleepy.  Tired.  Sore or achy, even in parts of your body where you did not have surgery.  Follow these instructions at home: For at least 24 hours after the procedure:  Do not: ? Participate in activities where you could fall or become injured. ? Drive. ? Use heavy machinery. ? Drink alcohol. ? Take sleeping pills or medicines that cause drowsiness. ? Make important decisions or sign legal documents. ? Take care of children on your own.  Rest. Eating and drinking  If you vomit, drink water, juice, or soup when you can drink without vomiting.  Drink enough fluid to keep your urine clear or pale yellow.  Make sure you have little or no nausea before eating solid foods.  Follow the diet recommended by your health care provider. General instructions  Have a responsible adult stay with you until you are awake and alert.  Return to your normal activities as told by your health care provider. Ask your health care provider what activities are safe for you.  Take over-the-counter and prescription medicines only as told by your health care provider.  If you smoke, do not smoke without supervision.  Keep all follow-up visits as told by your health care provider. This is important. Contact a health care provider if:  You continue to have nausea or vomiting at home, and medicines are not helpful.  You cannot drink fluids or start eating again.  You cannot urinate after 8-12 hours.  You develop a skin rash.  You have fever.  You have increasing redness at the site of your procedure. Get help right away if:  You have difficulty breathing.  You have chest pain.  You have unexpected bleeding.  You feel that you are having a life-threatening or urgent problem. This information is not intended to replace advice given to you by your health care provider. Make sure you discuss any questions you have with your health care provider. Document Released: 12/19/2000  Document Revised: 02/15/2016 Document Reviewed: 08/27/2015 Elsevier Interactive Patient Education  [employer].

## 2017-06-08 NOTE — Transfer of Care (Signed)
Immediate Anesthesia Transfer of Care Note  Patient: Bob AllanDavid Poteete  Procedure(s) Performed: Procedure(s): CYSTOSCOPY/URETEROSCOPY/HOLMIUM LASER/STENT PLACEMENT/ RETROGRADE PYELOGRAM (Right)  Patient Location: PACU  Anesthesia Type:General  Level of Consciousness:  sedated, patient cooperative and responds to stimulation  Airway & Oxygen Therapy:Patient Spontanous Breathing and Patient connected to face mask oxgen  Post-op Assessment:  Report given to PACU RN and Post -op Vital signs reviewed and stable  Post vital signs:  Reviewed and stable  Last Vitals:  Vitals:   06/08/17 1051  BP: 140/83  Pulse: 65  Resp: 16  Temp: 36.9 C  SpO2: 100%    Complications: No apparent anesthesia complications

## 2017-06-08 NOTE — Interval H&P Note (Signed)
History and Physical Interval Note:  06/08/2017 1:13 PM  Bob Anderson  has presented today for surgery, with the diagnosis of RIGHT URETERAL STONE  The various methods of treatment have been discussed with the patient and family. After consideration of risks, benefits and other options for treatment, the patient has consented to  Procedure(s): CYSTOSCOPY/URETEROSCOPY/HOLMIUM LASER/STENT PLACEMENT/ RETROGRADE PYELOGRAM (Right) as a surgical intervention .  The patient's history has been reviewed, patient examined, no change in status, stable for surgery.  I have reviewed the patient's chart and labs.  Questions were answered to the patient's satisfaction.     Kenichi Cassada,LES

## 2017-06-08 NOTE — Op Note (Addendum)
Preoperative diagnosis: Right distal ureteral calculus  Postoperative diagnosis: Right distal ureteral calculus  Procedure:  1. Cystoscopy 2. Right ureteroscopy and stone removal 3. Ureteroscopic laser lithotripsy 4. Right ureteral stent placement (6 x24 with string)  5. Right retrograde pyelography with interpretation  Surgeon: Moody BruinsLester S. Nikolai Wilczak, Jr. M.D.  Anesthesia: General  Complications: None  Intraoperative findings: Right retrograde pyelography demonstrated a filling defect within the distal right ureter consistent with the patient's known calculus without other abnormalities.  EBL: Minimal  Specimens: 1. Right ureteral calculus  Disposition of specimens: [facility] for stone analysis  Indication: Bob Anderson is a 46 y.o. year old patient with urolithiasis. After reviewing the management options for treatment, the patient elected to proceed with the above surgical procedure(s). We have discussed the potential benefits and risks of the procedure, side effects of the proposed treatment, the likelihood of the patient achieving the goals of the procedure, and any potential problems that might occur during the procedure or recuperation. Informed consent has been obtained.  Description of procedure:  The patient was taken to the operating room and general anesthesia was induced.  The patient was placed in the dorsal lithotomy position, prepped and draped in the usual sterile fashion, and preoperative antibiotics were administered. A preoperative time-out was performed.   Cystourethroscopy was performed.  The patient's urethra was examined and was normal. The bladder was then systematically examined in its entirety. There was no evidence for any bladder tumors, stones, or other mucosal pathology.    Attention then turned to the right ureteral orifice and a ureteral catheter was used to intubate the ureteral orifice.  Omnipaque contrast was injected through the  ureteral catheter and a retrograde pyelogram was performed with findings as dictated above.  A 0.38 sensor guidewire was then advanced up the right ureter into the renal pelvis under fluoroscopic guidance. The 6 Fr semirigid ureteroscope was then advanced into the ureter next to the guidewire and the calculus was identified.   The stone was then fragmented with the 365 micron holmium laser fiber on a setting of 0.6 J and frequency of 6 Hz.   All stones were then removed from the ureter with a zero tip nitinol basket.  Reinspection of the ureter revealed no remaining visible stones or fragments.   The wire was then backloaded through the cystoscope and a ureteral stent was advance over the wire using Seldinger technique.  The stent was positioned appropriately under fluoroscopic and cystoscopic guidance.  The wire was then removed with an adequate stent curl noted in the renal pelvis as well as in the bladder. A string tether was left in place.  The bladder was then emptied and the procedure ended.  The patient appeared to tolerate the procedure well and without complications.  The patient was able to be awakened and transferred to the recovery unit in satisfactory condition.

## 2017-06-09 ENCOUNTER — Encounter (HOSPITAL_COMMUNITY): Payer: Self-pay | Admitting: Urology

## 2019-06-10 ENCOUNTER — Other Ambulatory Visit: Payer: Self-pay

## 2019-06-10 ENCOUNTER — Other Ambulatory Visit (HOSPITAL_COMMUNITY): Payer: Self-pay | Admitting: Internal Medicine

## 2019-06-10 ENCOUNTER — Other Ambulatory Visit: Payer: Self-pay | Admitting: Internal Medicine

## 2019-06-10 ENCOUNTER — Ambulatory Visit
Admission: RE | Admit: 2019-06-10 | Discharge: 2019-06-10 | Disposition: A | Discharge status: Home / Self Care | Payer: BC Managed Care – PPO | Source: Ambulatory Visit | Attending: Internal Medicine | Admitting: Internal Medicine

## 2019-06-10 DIAGNOSIS — R1012 Left upper quadrant pain: Secondary | ICD-10-CM

## 2021-05-01 IMAGING — CT CT RENAL STONE PROTOCOL
2 of 4 series · 16 of 46 positions shown, 18 images · non-contrast
Comparison: None.

CLINICAL DATA: Left flank pain, left upper quadrant pain

EXAM:
CT ABDOMEN AND PELVIS WITHOUT CONTRAST
TECHNIQUE: Multidetector CT imaging of the abdomen and pelvis was performed
following the standard protocol without IV contrast.

[Series 2: stone full standard · axial · 0.68mm/px · z∈[-498,-98]mm · 13 of 90 slices shown, 15 images]
[im 5/90  soft-tissue]
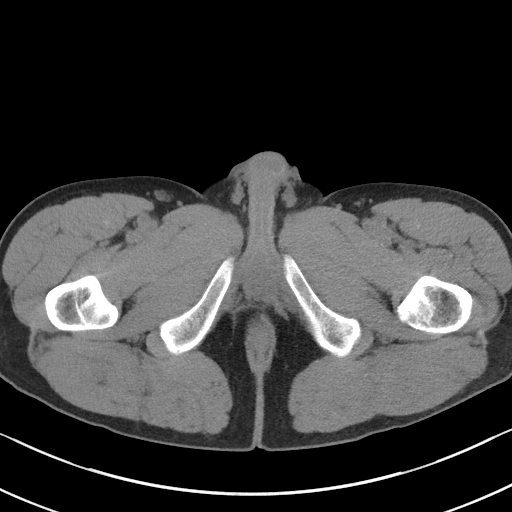
[im 5/90  bone]
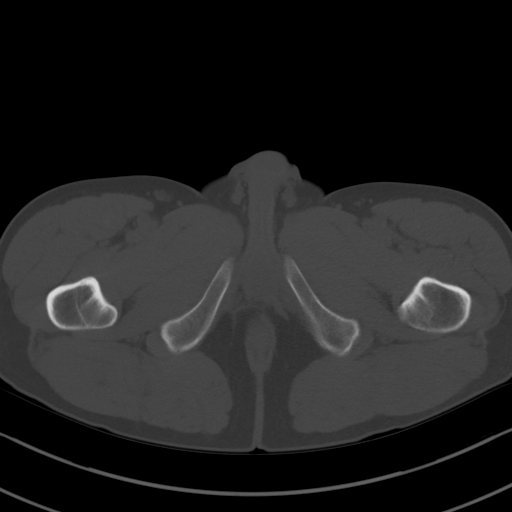
[im 13/90  soft-tissue]
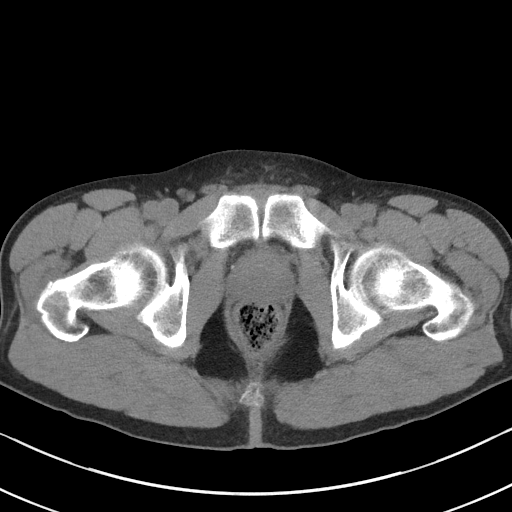
[im 21/90  soft-tissue]
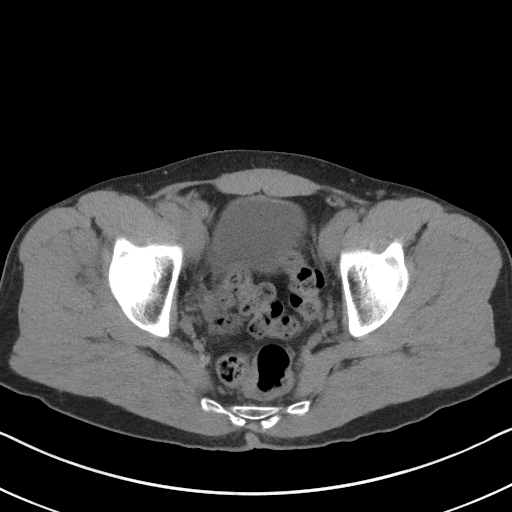
[im 25/90  soft-tissue]
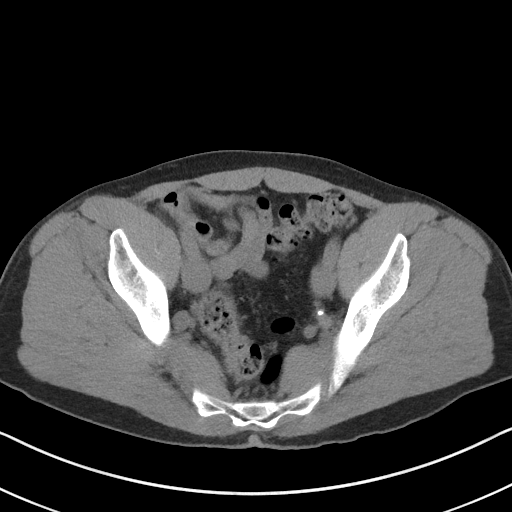
[im 33/90  soft-tissue]
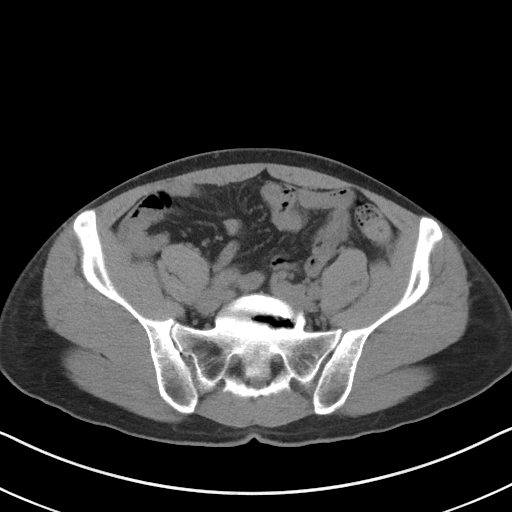
[im 37/90  soft-tissue]
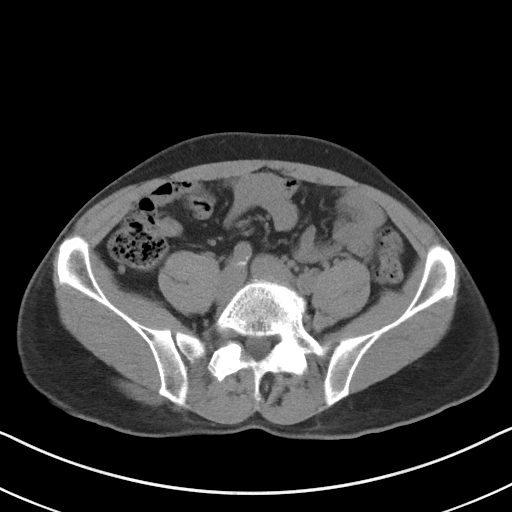
[im 45/90  soft-tissue]
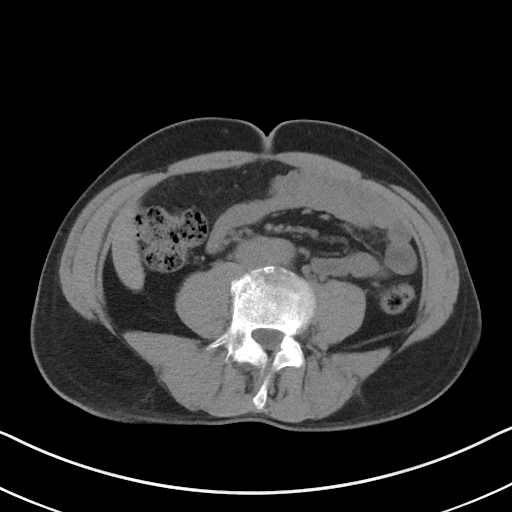
[im 53/90  soft-tissue]
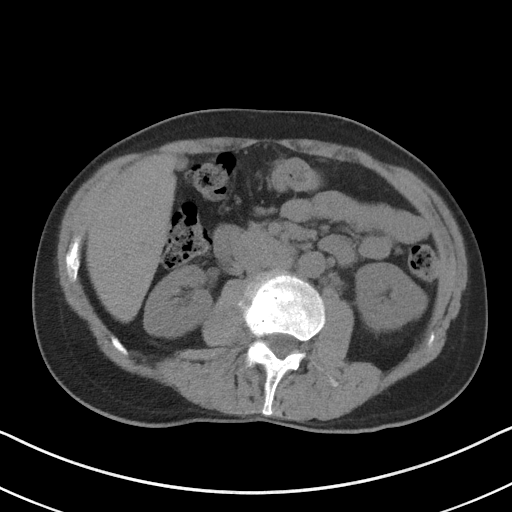
[im 57/90  soft-tissue]
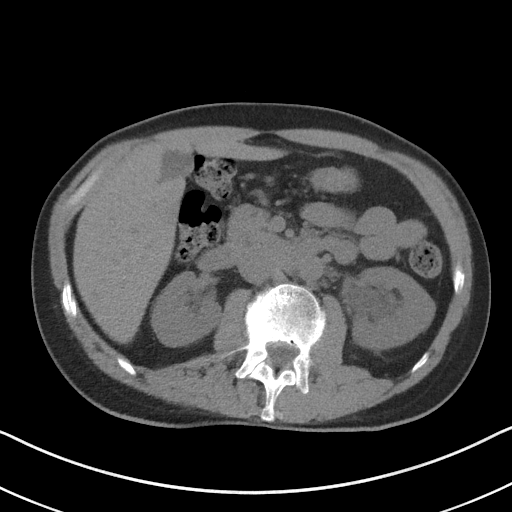
[im 57/90  bone]
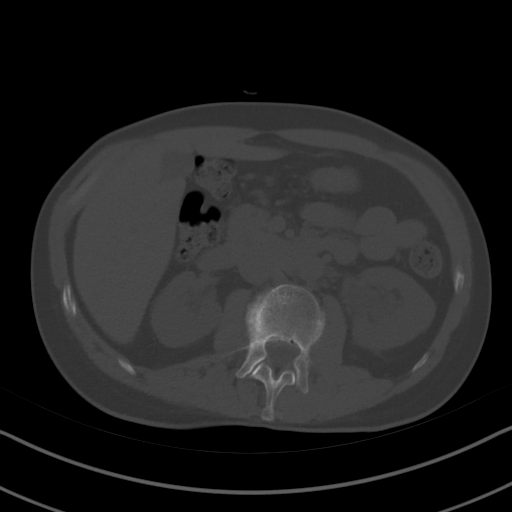
[im 65/90  soft-tissue]
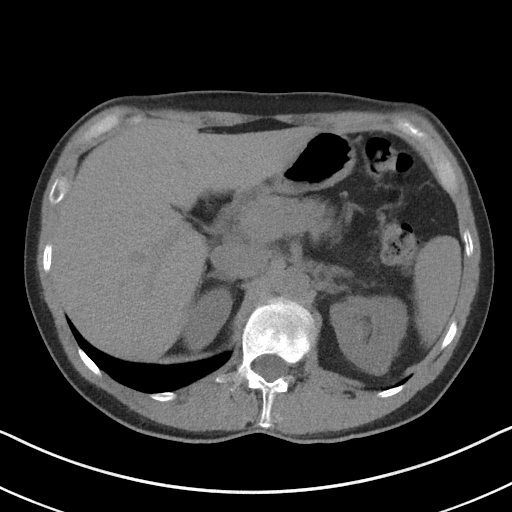
[im 69/90  soft-tissue]
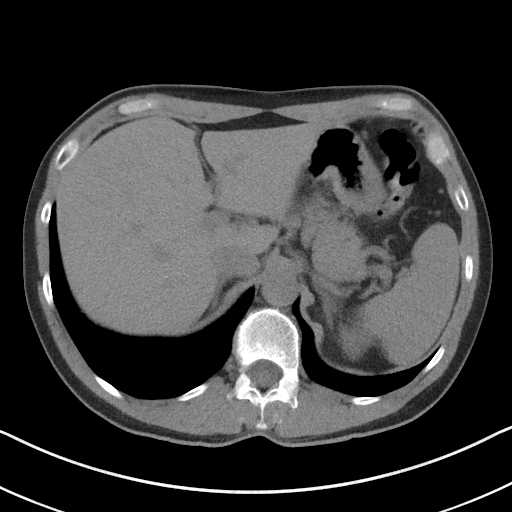
[im 77/90  soft-tissue]
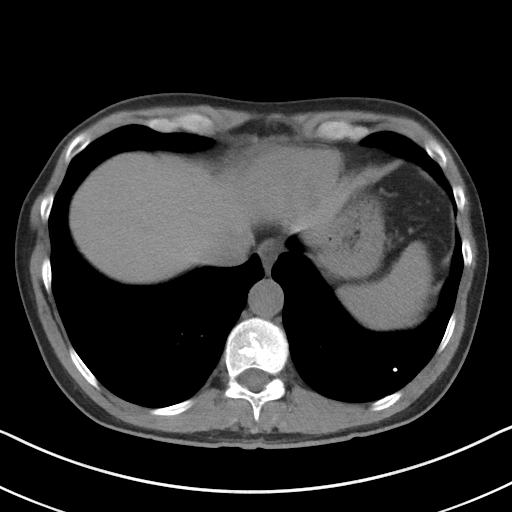
[im 85/90  soft-tissue]
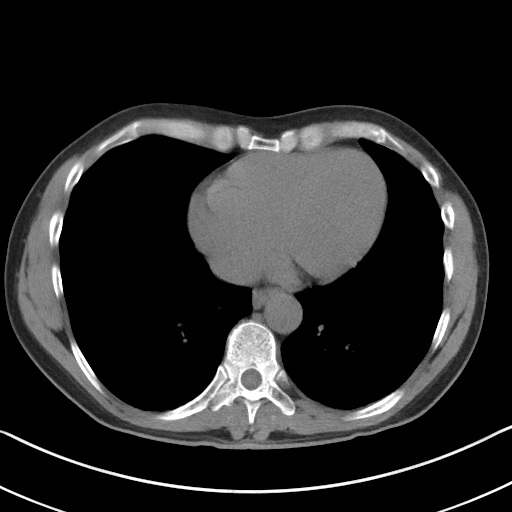

[Series 5: coronal · coronal · 0.74mm/px · 3 of 112 slices shown]
[im 38/112  soft-tissue]
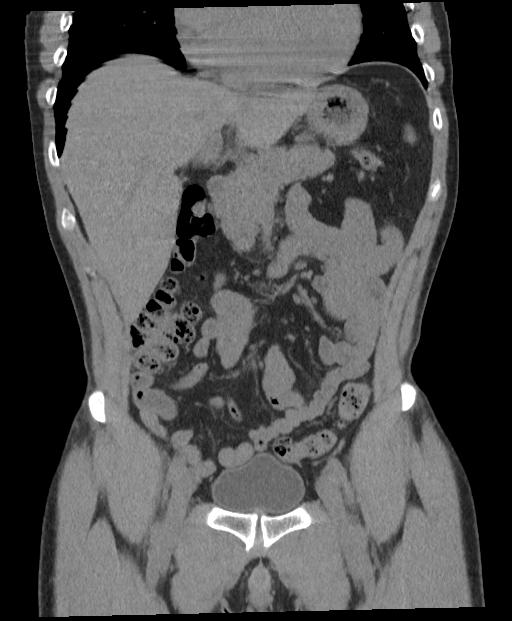
[im 50/112  soft-tissue]
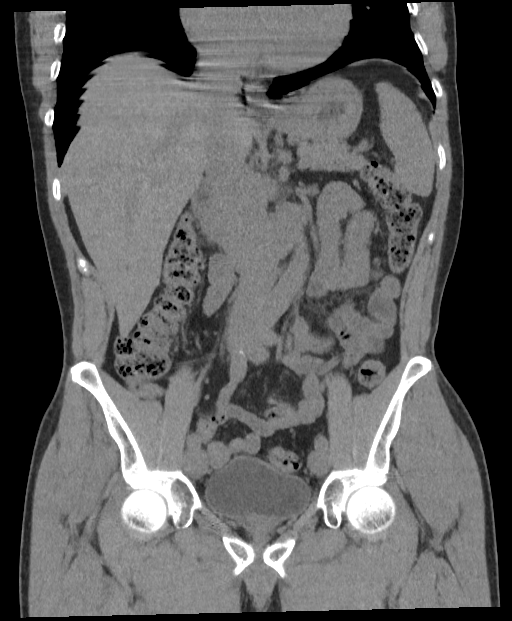
[im 62/112  soft-tissue]
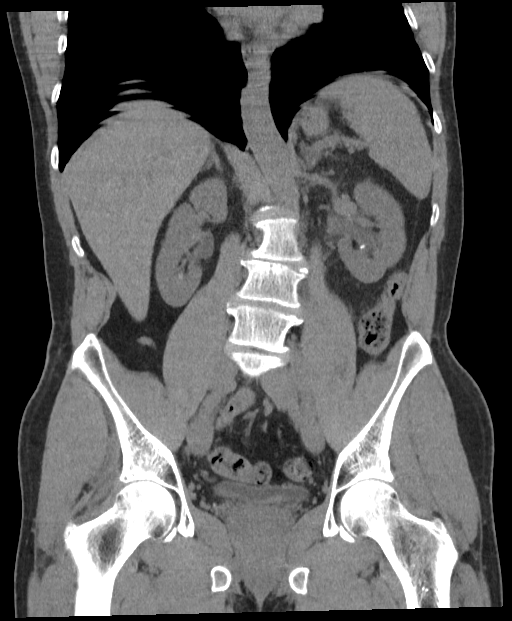

[16 of 46 positions shown; findings below may reference images not displayed]

FINDINGS: Lower chest: Calcified granuloma in the left lung base. Otherwise
lung bases are clear. No effusions. Heart is normal size.

Hepatobiliary: No focal hepatic abnormality. Gallbladder
unremarkable.

Pancreas: No focal abnormality or ductal dilatation.

Spleen: No focal abnormality.  Normal size.

Adrenals/Urinary Tract: There is mild to moderate left
hydronephrosis. No visible ureteral stones. There are peri ureteral
phleboliths in the pelvis which are stable since 1177 study. 3 mm
nonobstructing stone in the lower pole of the left kidney. No stones
or hydronephrosis on the right. Adrenal glands and urinary bladder
unremarkable.

Stomach/Bowel: Normal appendix. Stomach, large and small bowel
grossly unremarkable.

Vascular/Lymphatic: No evidence of aneurysm or adenopathy.

Reproductive: No visible focal abnormality.

Other: No free fluid or free air.

Musculoskeletal: No acute bony abnormality.
IMPRESSION: Mild to moderate left hydronephrosis and hydroureter. No visible
ureteral stones. This could be related to recently passed stone.

Left lower pole nephrolithiasis.

## 2023-03-23 ENCOUNTER — Ambulatory Visit: Payer: BC Managed Care – PPO

## 2023-03-23 DIAGNOSIS — Z1211 Encounter for screening for malignant neoplasm of colon: Secondary | ICD-10-CM | POA: Diagnosis not present

## 2023-03-23 DIAGNOSIS — K640 First degree hemorrhoids: Secondary | ICD-10-CM | POA: Diagnosis not present

## 2023-09-08 ENCOUNTER — Other Ambulatory Visit: Payer: Self-pay | Admitting: Internal Medicine

## 2023-09-08 DIAGNOSIS — Z8249 Family history of ischemic heart disease and other diseases of the circulatory system: Secondary | ICD-10-CM

## 2023-09-08 DIAGNOSIS — Z23 Encounter for immunization: Secondary | ICD-10-CM

## 2023-10-04 ENCOUNTER — Ambulatory Visit
Admission: RE | Admit: 2023-10-04 | Discharge: 2023-10-04 | Disposition: A | Discharge status: Home / Self Care | Payer: 59 | Source: Ambulatory Visit | Attending: Internal Medicine | Admitting: Internal Medicine

## 2023-10-04 DIAGNOSIS — Z8249 Family history of ischemic heart disease and other diseases of the circulatory system: Secondary | ICD-10-CM | POA: Insufficient documentation

## 2023-10-04 DIAGNOSIS — Z23 Encounter for immunization: Secondary | ICD-10-CM | POA: Insufficient documentation

## 2024-10-18 ENCOUNTER — Other Ambulatory Visit: Payer: Self-pay | Admitting: Otolaryngology

## 2024-10-18 DIAGNOSIS — H838X2 Other specified diseases of left inner ear: Secondary | ICD-10-CM

## 2024-10-23 ENCOUNTER — Ambulatory Visit
Admission: RE | Admit: 2024-10-23 | Discharge: 2024-10-23 | Disposition: A | Source: Ambulatory Visit | Attending: Otolaryngology | Admitting: Otolaryngology

## 2024-10-23 DIAGNOSIS — H838X2 Other specified diseases of left inner ear: Secondary | ICD-10-CM

## 2024-10-24 ENCOUNTER — Inpatient Hospital Stay: Admission: RE | Admit: 2024-10-24
